# Patient Record
Sex: Female | Born: 1960 | Race: White | Hispanic: No | Marital: Married | Smoking: Never smoker
Health system: Southern US, Community
[De-identification: ages and names within clinical notes are randomized; demographics above are authoritative.]

## PROBLEM LIST (undated history)

## (undated) DIAGNOSIS — E079 Disorder of thyroid, unspecified: Secondary | ICD-10-CM

## (undated) HISTORY — DX: Disorder of thyroid, unspecified: E07.9

---

## 1998-03-03 ENCOUNTER — Other Ambulatory Visit: Admission: RE | Admit: 1998-03-03 | Discharge: 1998-03-03 | Payer: Self-pay | Admitting: Obstetrics and Gynecology

## 2000-05-06 ENCOUNTER — Other Ambulatory Visit: Admission: RE | Admit: 2000-05-06 | Discharge: 2000-05-06 | Payer: Self-pay | Admitting: Obstetrics and Gynecology

## 2001-05-23 ENCOUNTER — Other Ambulatory Visit: Admission: RE | Admit: 2001-05-23 | Discharge: 2001-05-23 | Payer: Self-pay | Admitting: Obstetrics and Gynecology

## 2002-05-26 ENCOUNTER — Other Ambulatory Visit: Admission: RE | Admit: 2002-05-26 | Discharge: 2002-05-26 | Payer: Self-pay | Admitting: Obstetrics and Gynecology

## 2003-06-01 ENCOUNTER — Other Ambulatory Visit: Admission: RE | Admit: 2003-06-01 | Discharge: 2003-06-01 | Payer: Self-pay | Admitting: Obstetrics and Gynecology

## 2004-11-01 ENCOUNTER — Other Ambulatory Visit: Admission: RE | Admit: 2004-11-01 | Discharge: 2004-11-01 | Payer: Self-pay | Admitting: Obstetrics and Gynecology

## 2011-01-05 ENCOUNTER — Ambulatory Visit (INDEPENDENT_AMBULATORY_CARE_PROVIDER_SITE_OTHER): Payer: BC Managed Care – PPO

## 2011-01-05 DIAGNOSIS — Z23 Encounter for immunization: Secondary | ICD-10-CM

## 2013-10-06 ENCOUNTER — Other Ambulatory Visit: Payer: Self-pay | Admitting: Orthopaedic Surgery

## 2013-10-06 DIAGNOSIS — M542 Cervicalgia: Secondary | ICD-10-CM

## 2013-10-10 ENCOUNTER — Ambulatory Visit
Admission: RE | Admit: 2013-10-10 | Discharge: 2013-10-10 | Disposition: A | Discharge status: Home / Self Care | Payer: BC Managed Care – PPO | Source: Ambulatory Visit | Attending: Orthopaedic Surgery | Admitting: Orthopaedic Surgery

## 2013-10-10 DIAGNOSIS — M542 Cervicalgia: Secondary | ICD-10-CM

## 2013-11-09 ENCOUNTER — Ambulatory Visit (INDEPENDENT_AMBULATORY_CARE_PROVIDER_SITE_OTHER): Payer: BC Managed Care – PPO | Admitting: Neurology

## 2013-11-09 ENCOUNTER — Ambulatory Visit (INDEPENDENT_AMBULATORY_CARE_PROVIDER_SITE_OTHER): Payer: Self-pay

## 2013-11-09 DIAGNOSIS — M4722 Other spondylosis with radiculopathy, cervical region: Secondary | ICD-10-CM

## 2013-11-09 DIAGNOSIS — M542 Cervicalgia: Secondary | ICD-10-CM

## 2013-11-09 DIAGNOSIS — Z0289 Encounter for other administrative examinations: Secondary | ICD-10-CM

## 2013-11-09 NOTE — Progress Notes (Signed)
   [facility]  Provider:  Dr Lucia GaskinsAhern Referring Provider: Letta KocherSaullo, Thomas, MD Primary Care Physician:  Lolita PatellaEADE,ROBERT ALEXANDER, MD   HPI:  Brandi DeterKathleen J Aguilar is a 53 y.o. female here as a referral from Dr. Retia PasseSaullo for neck pain. She reports left-sided neck pain that has been worsening despite physical therapy. She endorses radiation down the arm to the ulnar aspect of the forearm. 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   Summary: Nerve Conduction studies were performed on the bilateral upper extremities.  Bilateral Median motor nerves were within normal limits with normal F wave latencies.  The left Ulnar motor nerve showed reduced amplitude (0.6367mV, N>5) with normal F wave latency. The right Ulnar motor nerve was within normal limits with normal F wave latency.  Bilateral 2nd-digit Median and bilateral 5th-digit Ulnar sensory conductions were within normal limits.    EMG needle evaluation was performed on selected left upper extremity muscles: The left Triceps showed increased motor unit duration, and diminished recruitment. The left Flexor Digitorum Profundus (ulnar), First Dorsal Interosseous and Opponens Pollicis showed markedly increased motor unit amplitude, increased motor unit duration, and diminished recruitment. The left Extensor Indicis showed diminished recruitment. The left infraspinatus, left Supraspinatus, left Deltoid, left Biceps, left Pronator Teres muscles and left C7/C8 paraspinal muscles were normal.  Conclusion: This is an abnormal study. 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 No electrophysiologic evidence for  acute/ongoing cervical radiculopathy.   Naomie DeanAntonia Ahern, MD  [facility] [address] [place], [place] [id number]  Phone [phone] Fax [phone]

## 2014-06-26 ENCOUNTER — Ambulatory Visit (INDEPENDENT_AMBULATORY_CARE_PROVIDER_SITE_OTHER): Payer: BLUE CROSS/BLUE SHIELD | Admitting: Physician Assistant

## 2014-06-26 VITALS — BP 124/78 | HR 98 | Temp 98.8°F | Resp 17 | Ht 67.5 in | Wt 145.0 lb

## 2014-06-26 DIAGNOSIS — G43909 Migraine, unspecified, not intractable, without status migrainosus: Secondary | ICD-10-CM | POA: Insufficient documentation

## 2014-06-26 DIAGNOSIS — L255 Unspecified contact dermatitis due to plants, except food: Secondary | ICD-10-CM | POA: Diagnosis not present

## 2014-06-26 DIAGNOSIS — H409 Unspecified glaucoma: Secondary | ICD-10-CM | POA: Insufficient documentation

## 2014-06-26 DIAGNOSIS — L299 Pruritus, unspecified: Secondary | ICD-10-CM

## 2014-06-26 MED ORDER — HYDROXYZINE HCL 25 MG PO TABS: 25.0000 mg | ORAL_TABLET | Freq: Three times a day (TID) | ORAL | Status: AC | PRN

## 2014-06-26 MED ORDER — TRIAMCINOLONE ACETONIDE 0.5 % EX CREA: 1.0000 "application " | TOPICAL_CREAM | Freq: Two times a day (BID) | CUTANEOUS | Status: AC

## 2014-06-26 NOTE — Progress Notes (Signed)
   Brandi Aguilar  MRN: [id number] DOB: [birth year]  Subjective:  Pt presents to clinic with rash on her right arm and at the back of her neck.  She was working in the yard about 2 weeks ago and thinks now that she might have been exposed to poison ivy.  She started to break out about 5 days ago and the worst is the itching and burning at the rash site.  She has used some benadryl spray but that does not help much.  She has only had poison ivy once in the past and it was a similar delayed rash response. Home treatment - benadryl spray  Patient Active Problem List   Diagnosis Date Noted  . Hyperthyroidism 06/26/2014  . Migraines 06/26/2014  . Glaucoma 06/26/2014    No current outpatient prescriptions on file prior to visit.   No current facility-administered medications on file prior to visit.    Allergies  Allergen Reactions  . Keflex [Cephalexin] Other (See Comments)    Doesn't remember    Review of Systems  Constitutional: Negative for fever and chills.  Skin: Positive for rash.   Objective:  BP 124/78 mmHg  Pulse 98  Temp(Src) 98.8 F (37.1 C) (Oral)  Resp 17  Ht 5' 7.5" (1.715 m)  Wt 145 lb (65.772 kg)  BMI 22.36 kg/m2  SpO2 99%  Physical Exam  Constitutional: She is oriented to person, place, and time and well-developed, well-nourished, and in no distress.  HENT:  Head: Normocephalic and atraumatic.  Right Ear: Hearing and external ear normal.  Left Ear: Hearing and external ear normal.  Eyes: Conjunctivae are normal.  Neck: Normal range of motion.  Neurological: She is alert and oriented to person, place, and time. Gait normal.  Skin: Skin is warm and dry. Rash (vesicular rash on flexor surface of elbow on the right - some scabed - no surrounding erythema or others signs of infection) noted.  Psychiatric: Mood, memory, affect and judgment normal.  Vitals reviewed.  Assessment and Plan :  Itching  Rhus dermatitis - Plan: hydrOXYzine (ATARAX/VISTARIL)  25 MG tablet, triamcinolone cream (KENALOG) 0.5 %   Pt has poison ivy and the amount of rash will require topical treatment only and treatment for the itching.  We discussed ways to help with the itching.  Benny Lennert PA-C  [facility] 06/26/2014 9:17 AM

## 2014-08-30 ENCOUNTER — Ambulatory Visit: Payer: Self-pay | Admitting: Endocrinology

## 2015-08-18 DIAGNOSIS — M4722 Other spondylosis with radiculopathy, cervical region: Secondary | ICD-10-CM | POA: Diagnosis not present

## 2015-08-18 DIAGNOSIS — M5416 Radiculopathy, lumbar region: Secondary | ICD-10-CM | POA: Diagnosis not present

## 2015-08-23 DIAGNOSIS — E0590 Thyrotoxicosis, unspecified without thyrotoxic crisis or storm: Secondary | ICD-10-CM | POA: Diagnosis not present

## 2015-09-22 DIAGNOSIS — M5413 Radiculopathy, cervicothoracic region: Secondary | ICD-10-CM | POA: Diagnosis not present

## 2015-09-22 DIAGNOSIS — M5416 Radiculopathy, lumbar region: Secondary | ICD-10-CM | POA: Diagnosis not present

## 2015-10-04 DIAGNOSIS — H401211 Low-tension glaucoma, right eye, mild stage: Secondary | ICD-10-CM | POA: Diagnosis not present

## 2015-10-04 DIAGNOSIS — H5213 Myopia, bilateral: Secondary | ICD-10-CM | POA: Diagnosis not present

## 2015-10-04 DIAGNOSIS — H401221 Low-tension glaucoma, left eye, mild stage: Secondary | ICD-10-CM | POA: Diagnosis not present

## 2015-10-14 DIAGNOSIS — Z6822 Body mass index (BMI) 22.0-22.9, adult: Secondary | ICD-10-CM | POA: Diagnosis not present

## 2015-10-14 DIAGNOSIS — Z01419 Encounter for gynecological examination (general) (routine) without abnormal findings: Secondary | ICD-10-CM | POA: Diagnosis not present

## 2015-10-14 DIAGNOSIS — Z1231 Encounter for screening mammogram for malignant neoplasm of breast: Secondary | ICD-10-CM | POA: Diagnosis not present

## 2015-10-20 DIAGNOSIS — Z23 Encounter for immunization: Secondary | ICD-10-CM | POA: Diagnosis not present

## 2015-11-03 DIAGNOSIS — Z6822 Body mass index (BMI) 22.0-22.9, adult: Secondary | ICD-10-CM | POA: Diagnosis not present

## 2015-11-03 DIAGNOSIS — M791 Myalgia: Secondary | ICD-10-CM | POA: Diagnosis not present

## 2015-11-03 DIAGNOSIS — M5413 Radiculopathy, cervicothoracic region: Secondary | ICD-10-CM | POA: Diagnosis not present

## 2015-11-03 DIAGNOSIS — M5416 Radiculopathy, lumbar region: Secondary | ICD-10-CM | POA: Diagnosis not present

## 2015-11-21 ENCOUNTER — Other Ambulatory Visit: Payer: Self-pay | Admitting: Rehabilitation

## 2015-11-21 DIAGNOSIS — M5416 Radiculopathy, lumbar region: Secondary | ICD-10-CM

## 2015-11-22 DIAGNOSIS — E0500 Thyrotoxicosis with diffuse goiter without thyrotoxic crisis or storm: Secondary | ICD-10-CM | POA: Diagnosis not present

## 2015-11-30 ENCOUNTER — Ambulatory Visit
Admission: RE | Admit: 2015-11-30 | Discharge: 2015-11-30 | Disposition: A | Discharge status: Home / Self Care | Payer: BLUE CROSS/BLUE SHIELD | Source: Ambulatory Visit | Attending: Rehabilitation | Admitting: Rehabilitation

## 2015-11-30 DIAGNOSIS — M5126 Other intervertebral disc displacement, lumbar region: Secondary | ICD-10-CM | POA: Diagnosis not present

## 2015-11-30 DIAGNOSIS — M5416 Radiculopathy, lumbar region: Secondary | ICD-10-CM

## 2015-12-01 DIAGNOSIS — M5126 Other intervertebral disc displacement, lumbar region: Secondary | ICD-10-CM | POA: Diagnosis not present

## 2015-12-01 DIAGNOSIS — M5416 Radiculopathy, lumbar region: Secondary | ICD-10-CM | POA: Diagnosis not present

## 2015-12-01 DIAGNOSIS — M5413 Radiculopathy, cervicothoracic region: Secondary | ICD-10-CM | POA: Diagnosis not present

## 2015-12-06 DIAGNOSIS — E0590 Thyrotoxicosis, unspecified without thyrotoxic crisis or storm: Secondary | ICD-10-CM | POA: Diagnosis not present

## 2015-12-15 DIAGNOSIS — G43839 Menstrual migraine, intractable, without status migrainosus: Secondary | ICD-10-CM | POA: Diagnosis not present

## 2015-12-15 DIAGNOSIS — G43111 Migraine with aura, intractable, with status migrainosus: Secondary | ICD-10-CM | POA: Diagnosis not present

## 2015-12-15 DIAGNOSIS — G43719 Chronic migraine without aura, intractable, without status migrainosus: Secondary | ICD-10-CM | POA: Diagnosis not present

## 2015-12-15 DIAGNOSIS — G43019 Migraine without aura, intractable, without status migrainosus: Secondary | ICD-10-CM | POA: Diagnosis not present

## 2015-12-20 DIAGNOSIS — Z13220 Encounter for screening for lipoid disorders: Secondary | ICD-10-CM | POA: Diagnosis not present

## 2015-12-20 DIAGNOSIS — M79674 Pain in right toe(s): Secondary | ICD-10-CM | POA: Diagnosis not present

## 2015-12-20 DIAGNOSIS — G4700 Insomnia, unspecified: Secondary | ICD-10-CM | POA: Diagnosis not present

## 2015-12-20 DIAGNOSIS — M47812 Spondylosis without myelopathy or radiculopathy, cervical region: Secondary | ICD-10-CM | POA: Diagnosis not present

## 2015-12-20 DIAGNOSIS — E0590 Thyrotoxicosis, unspecified without thyrotoxic crisis or storm: Secondary | ICD-10-CM | POA: Diagnosis not present

## 2015-12-20 DIAGNOSIS — Z0000 Encounter for general adult medical examination without abnormal findings: Secondary | ICD-10-CM | POA: Diagnosis not present

## 2016-01-05 DIAGNOSIS — E0500 Thyrotoxicosis with diffuse goiter without thyrotoxic crisis or storm: Secondary | ICD-10-CM | POA: Diagnosis not present

## 2016-02-06 DIAGNOSIS — E0500 Thyrotoxicosis with diffuse goiter without thyrotoxic crisis or storm: Secondary | ICD-10-CM | POA: Diagnosis not present

## 2016-03-08 DIAGNOSIS — E0500 Thyrotoxicosis with diffuse goiter without thyrotoxic crisis or storm: Secondary | ICD-10-CM | POA: Diagnosis not present

## 2016-04-05 DIAGNOSIS — E0500 Thyrotoxicosis with diffuse goiter without thyrotoxic crisis or storm: Secondary | ICD-10-CM | POA: Diagnosis not present

## 2016-04-09 DIAGNOSIS — H401231 Low-tension glaucoma, bilateral, mild stage: Secondary | ICD-10-CM | POA: Diagnosis not present

## 2016-04-12 DIAGNOSIS — E0590 Thyrotoxicosis, unspecified without thyrotoxic crisis or storm: Secondary | ICD-10-CM | POA: Diagnosis not present

## 2016-04-23 DIAGNOSIS — E0500 Thyrotoxicosis with diffuse goiter without thyrotoxic crisis or storm: Secondary | ICD-10-CM | POA: Diagnosis not present

## 2016-04-24 ENCOUNTER — Other Ambulatory Visit (HOSPITAL_COMMUNITY): Payer: Self-pay | Admitting: Endocrinology

## 2016-04-24 DIAGNOSIS — E0500 Thyrotoxicosis with diffuse goiter without thyrotoxic crisis or storm: Secondary | ICD-10-CM

## 2016-04-24 DIAGNOSIS — R002 Palpitations: Secondary | ICD-10-CM | POA: Diagnosis not present

## 2016-04-24 DIAGNOSIS — E041 Nontoxic single thyroid nodule: Secondary | ICD-10-CM | POA: Diagnosis not present

## 2016-04-26 ENCOUNTER — Encounter (HOSPITAL_COMMUNITY)
Admission: RE | Admit: 2016-04-26 | Discharge: 2016-04-26 | Disposition: A | Discharge status: Home / Self Care | Payer: BLUE CROSS/BLUE SHIELD | Source: Ambulatory Visit | Attending: Endocrinology | Admitting: Endocrinology

## 2016-04-26 DIAGNOSIS — E0500 Thyrotoxicosis with diffuse goiter without thyrotoxic crisis or storm: Secondary | ICD-10-CM | POA: Insufficient documentation

## 2016-04-26 MED ORDER — SODIUM IODIDE I 131 CAPSULE
11.5000 | Freq: Once | INTRAVENOUS | Status: AC | PRN
  Administered 2016-04-26: 11.5 via ORAL

## 2016-04-27 ENCOUNTER — Encounter (HOSPITAL_COMMUNITY)
Admission: RE | Admit: 2016-04-27 | Discharge: 2016-04-27 | Disposition: A | Discharge status: Home / Self Care | Payer: BLUE CROSS/BLUE SHIELD | Source: Ambulatory Visit | Attending: Endocrinology | Admitting: Endocrinology

## 2016-04-27 DIAGNOSIS — E0500 Thyrotoxicosis with diffuse goiter without thyrotoxic crisis or storm: Secondary | ICD-10-CM | POA: Diagnosis not present

## 2016-04-27 DIAGNOSIS — E041 Nontoxic single thyroid nodule: Secondary | ICD-10-CM | POA: Diagnosis not present

## 2016-04-27 MED ORDER — SODIUM PERTECHNETATE TC 99M INJECTION
10.0000 | Freq: Once | INTRAVENOUS | Status: AC | PRN
  Administered 2016-04-27: 10 via INTRAVENOUS

## 2016-04-30 ENCOUNTER — Ambulatory Visit (HOSPITAL_COMMUNITY)
Admission: RE | Admit: 2016-04-30 | Discharge: 2016-04-30 | Disposition: A | Discharge status: Home / Self Care | Payer: BLUE CROSS/BLUE SHIELD | Source: Ambulatory Visit | Attending: Endocrinology | Admitting: Endocrinology

## 2016-04-30 DIAGNOSIS — E0500 Thyrotoxicosis with diffuse goiter without thyrotoxic crisis or storm: Secondary | ICD-10-CM | POA: Insufficient documentation

## 2016-04-30 DIAGNOSIS — E0590 Thyrotoxicosis, unspecified without thyrotoxic crisis or storm: Secondary | ICD-10-CM | POA: Diagnosis not present

## 2016-04-30 LAB — HCG, SERUM, QUALITATIVE: PREG SERUM: NEGATIVE

## 2016-04-30 MED ORDER — SODIUM IODIDE I 131 CAPSULE
15.5000 | Freq: Once | INTRAVENOUS | Status: AC | PRN
  Administered 2016-04-30: 15.5 via ORAL

## 2016-05-03 DIAGNOSIS — E0500 Thyrotoxicosis with diffuse goiter without thyrotoxic crisis or storm: Secondary | ICD-10-CM | POA: Diagnosis not present

## 2016-05-07 ENCOUNTER — Other Ambulatory Visit (HOSPITAL_COMMUNITY): Payer: BLUE CROSS/BLUE SHIELD

## 2016-05-07 ENCOUNTER — Ambulatory Visit (HOSPITAL_COMMUNITY): Payer: BLUE CROSS/BLUE SHIELD

## 2016-05-08 ENCOUNTER — Other Ambulatory Visit (HOSPITAL_COMMUNITY): Payer: BLUE CROSS/BLUE SHIELD

## 2016-05-14 DIAGNOSIS — E0500 Thyrotoxicosis with diffuse goiter without thyrotoxic crisis or storm: Secondary | ICD-10-CM | POA: Diagnosis not present

## 2016-05-17 DIAGNOSIS — E0500 Thyrotoxicosis with diffuse goiter without thyrotoxic crisis or storm: Secondary | ICD-10-CM | POA: Diagnosis not present

## 2016-05-28 DIAGNOSIS — E0500 Thyrotoxicosis with diffuse goiter without thyrotoxic crisis or storm: Secondary | ICD-10-CM | POA: Diagnosis not present

## 2016-05-29 DIAGNOSIS — D225 Melanocytic nevi of trunk: Secondary | ICD-10-CM | POA: Diagnosis not present

## 2016-05-29 DIAGNOSIS — L821 Other seborrheic keratosis: Secondary | ICD-10-CM | POA: Diagnosis not present

## 2016-05-29 DIAGNOSIS — D1801 Hemangioma of skin and subcutaneous tissue: Secondary | ICD-10-CM | POA: Diagnosis not present

## 2016-05-29 DIAGNOSIS — L814 Other melanin hyperpigmentation: Secondary | ICD-10-CM | POA: Diagnosis not present

## 2016-05-31 DIAGNOSIS — E0590 Thyrotoxicosis, unspecified without thyrotoxic crisis or storm: Secondary | ICD-10-CM | POA: Diagnosis not present

## 2016-06-12 DIAGNOSIS — G43111 Migraine with aura, intractable, with status migrainosus: Secondary | ICD-10-CM | POA: Diagnosis not present

## 2016-06-12 DIAGNOSIS — G43019 Migraine without aura, intractable, without status migrainosus: Secondary | ICD-10-CM | POA: Diagnosis not present

## 2016-06-12 DIAGNOSIS — G43839 Menstrual migraine, intractable, without status migrainosus: Secondary | ICD-10-CM | POA: Diagnosis not present

## 2016-06-12 DIAGNOSIS — E0500 Thyrotoxicosis with diffuse goiter without thyrotoxic crisis or storm: Secondary | ICD-10-CM | POA: Diagnosis not present

## 2016-06-12 DIAGNOSIS — G43719 Chronic migraine without aura, intractable, without status migrainosus: Secondary | ICD-10-CM | POA: Diagnosis not present

## 2016-06-25 DIAGNOSIS — E0500 Thyrotoxicosis with diffuse goiter without thyrotoxic crisis or storm: Secondary | ICD-10-CM | POA: Diagnosis not present

## 2016-06-28 DIAGNOSIS — R002 Palpitations: Secondary | ICD-10-CM | POA: Diagnosis not present

## 2016-06-28 DIAGNOSIS — E0590 Thyrotoxicosis, unspecified without thyrotoxic crisis or storm: Secondary | ICD-10-CM | POA: Diagnosis not present

## 2016-07-02 DIAGNOSIS — E0500 Thyrotoxicosis with diffuse goiter without thyrotoxic crisis or storm: Secondary | ICD-10-CM | POA: Diagnosis not present

## 2016-07-09 DIAGNOSIS — E0500 Thyrotoxicosis with diffuse goiter without thyrotoxic crisis or storm: Secondary | ICD-10-CM | POA: Diagnosis not present

## 2016-07-16 DIAGNOSIS — E0500 Thyrotoxicosis with diffuse goiter without thyrotoxic crisis or storm: Secondary | ICD-10-CM | POA: Diagnosis not present

## 2016-07-23 DIAGNOSIS — E0500 Thyrotoxicosis with diffuse goiter without thyrotoxic crisis or storm: Secondary | ICD-10-CM | POA: Diagnosis not present

## 2016-08-03 ENCOUNTER — Ambulatory Visit (INDEPENDENT_AMBULATORY_CARE_PROVIDER_SITE_OTHER): Payer: BLUE CROSS/BLUE SHIELD | Admitting: Endocrinology

## 2016-08-03 ENCOUNTER — Encounter: Payer: Self-pay | Admitting: Endocrinology

## 2016-08-03 VITALS — BP 122/84 | HR 92 | Wt 136.6 lb

## 2016-08-03 DIAGNOSIS — E0590 Thyrotoxicosis, unspecified without thyrotoxic crisis or storm: Secondary | ICD-10-CM

## 2016-08-03 LAB — T4, FREE: Free T4: 0.57 ng/dL — ABNORMAL LOW (ref 0.60–1.60)

## 2016-08-03 LAB — TSH: TSH: 3.14 u[IU]/mL (ref 0.35–4.50)

## 2016-08-03 NOTE — Progress Notes (Signed)
Subjective:    Patient ID: Brandi Aguilar, female    DOB: 02-06-1960, 56 y.o.   MRN: [id number]  HPI Pt is referred by Dr Nicholos Johns, for hyperthyroidism.  Pt reports he was dx'ed with hyperthyroidism in 1998.  She took tapazole from 1998 until she took RAI in April of 2018.  She took tapazole again while the RAI was working, until she stopped last week, when it was changed to synthroid 75/d.  she has never had XRT to the anterior neck, or thyroid surgery.  she does not consume kelp or any other prescribed or non-prescribed thyroid medication.  she has never been on amiodarone.  She has moderate headache throughout the head, and assoc tremor.  Past Medical History:  Diagnosis Date  . Thyroid disease     No past surgical history on file.  Social History   Social History  . Marital status: Married    Spouse name: N/A  . Number of children: N/A  . Years of education: N/A   Occupational History  . Not on file.   Social History Main Topics  . Smoking status: Never Smoker  . Smokeless tobacco: Never Used  . Alcohol use No  . Drug use: No  . Sexual activity: No   Other Topics Concern  . Not on file   Social History Narrative  . No narrative on file    Current Outpatient Prescriptions on File Prior to Visit  Medication Sig Dispense Refill  . latanoprost (XALATAN) 0.005 % ophthalmic solution 1 drop at bedtime.    . rizatriptan (MAXALT) 10 MG tablet Take 10 mg by mouth as needed for migraine. May repeat in 2 hours if needed    . hydrOXYzine (ATARAX/VISTARIL) 25 MG tablet Take 1 tablet (25 mg total) by mouth 3 (three) times daily as needed for itching. (Patient not taking: Reported on 08/03/2016) 21 tablet 0  . triamcinolone cream (KENALOG) 0.5 % Apply 1 application topically 2 (two) times daily. (Patient not taking: Reported on 08/03/2016) 30 g 0  . zonisamide (ZONEGRAN) 100 MG capsule Take 250 mg by mouth daily.     No current facility-administered medications on file prior to  visit.     Allergies  Allergen Reactions  . Keflex [Cephalexin] Other (See Comments)    Doesn't remember    Family History  Problem Relation Age of Onset  . Hypertension Mother   . Diabetes Father   . Diabetes Brother   . Graves' disease Brother   . Hypertension Maternal Grandfather     BP 122/84   Pulse 92   Wt 136 lb 9.6 oz (62 kg)   SpO2 97%   BMI 21.08 kg/m    Review of Systems denies fever, hoarseness, diplopia, palpitations, sob, diarrhea, muscle weakness, excessive diaphoresis, anxiety, heat intolerance, easy bruising, and rhinorrhea.  She has regained a few lbs.  No seizure. She has frequent urination.     Objective:   Physical Exam VS: see vs page GEN: no distress HEAD: head: no deformity eyes: no periorbital swelling, no proptosis external nose and ears are normal mouth: no lesion seen NECK: supple, thyroid is not enlarged.  CHEST WALL: no deformity LUNGS: clear to auscultation CV: reg rate and rhythm, no murmur ABD: abdomen is soft, nontender.  no hepatosplenomegaly.  not distended.  no hernia MUSCULOSKELETAL: muscle bulk and strength are grossly normal.  no obvious joint swelling.  gait is normal and steady EXTEMITIES: no deformity.  no ulcer on the feet.  feet  are of normal color and temp.  no edema PULSES: dorsalis pedis intact bilat.  no carotid bruit NEURO:  cn 2-12 grossly intact.   readily moves all 4's.  sensation is intact to touch on the feet.  Slight tremor of the hands SKIN:  Normal texture and temperature.  No rash or suspicious lesion is visible.  Not diaphoretic NODES:  None palpable at the neck.   PSYCH: alert, well-oriented.  Does not appear anxious nor depressed.    I have reviewed outside records, and summarized: Pt was noted to have hyperthyroidism, and referred here.  [facility], and was noted to have a flare of hyperthyroidism.  Several other health probds were addressed, including insomnia, toe pain, and  paresthesias.   Lab Results  Component Value Date   TSH 3.14 08/03/2016      Assessment & Plan:  Post-RAI hypothyroidism, new.  She needs slightly increased rx.   Headache: pt says worse since tapazole was changed to synthroid.  Therefore, I offered pt the option of continuing the same synthroid for now.  Tremor: in this setting, pt should consider continuing same synthroid.

## 2016-08-03 NOTE — Patient Instructions (Signed)
blood tests are requested for you today.  We'll let you know about the results.  Please come back for a follow-up appointment in 1 month.  

## 2016-08-06 ENCOUNTER — Telehealth: Payer: Self-pay | Admitting: Endocrinology

## 2016-08-06 DIAGNOSIS — E0590 Thyrotoxicosis, unspecified without thyrotoxic crisis or storm: Secondary | ICD-10-CM

## 2016-08-06 NOTE — Telephone Encounter (Signed)
Called patient and she will come in two weeks to do labs.

## 2016-08-06 NOTE — Telephone Encounter (Signed)
Ok.  I requested labs for approx 2 weeks

## 2016-08-06 NOTE — Telephone Encounter (Signed)
Patient wants to stay on medication and dosage she is on now, and retest in two weeks.   Thank you,  -LL

## 2016-08-20 ENCOUNTER — Other Ambulatory Visit (INDEPENDENT_AMBULATORY_CARE_PROVIDER_SITE_OTHER): Payer: BLUE CROSS/BLUE SHIELD

## 2016-08-20 DIAGNOSIS — E0590 Thyrotoxicosis, unspecified without thyrotoxic crisis or storm: Secondary | ICD-10-CM | POA: Diagnosis not present

## 2016-08-20 LAB — T4, FREE: FREE T4: 0.86 ng/dL (ref 0.60–1.60)

## 2016-08-20 LAB — TSH: TSH: 0.81 u[IU]/mL (ref 0.35–4.50)

## 2016-08-23 ENCOUNTER — Other Ambulatory Visit: Payer: Self-pay

## 2016-08-23 ENCOUNTER — Encounter: Payer: Self-pay | Admitting: Endocrinology

## 2016-08-23 MED ORDER — LEVOTHYROXINE SODIUM 75 MCG PO TABS: 75.0000 ug | ORAL_TABLET | Freq: Every day | ORAL | 5 refills | Status: DC

## 2016-09-19 ENCOUNTER — Encounter: Payer: Self-pay | Admitting: Endocrinology

## 2016-09-19 ENCOUNTER — Ambulatory Visit (INDEPENDENT_AMBULATORY_CARE_PROVIDER_SITE_OTHER): Payer: BLUE CROSS/BLUE SHIELD | Admitting: Endocrinology

## 2016-09-19 VITALS — BP 122/78 | HR 80 | Wt 140.2 lb

## 2016-09-19 DIAGNOSIS — E0590 Thyrotoxicosis, unspecified without thyrotoxic crisis or storm: Secondary | ICD-10-CM

## 2016-09-19 DIAGNOSIS — Z23 Encounter for immunization: Secondary | ICD-10-CM | POA: Diagnosis not present

## 2016-09-19 LAB — TSH: TSH: 2.92 u[IU]/mL (ref 0.35–4.50)

## 2016-09-19 LAB — T4, FREE: Free T4: 0.79 ng/dL (ref 0.60–1.60)

## 2016-09-19 NOTE — Patient Instructions (Signed)
blood tests are requested for you today.  We'll let you know about the results.  Please come back for a follow-up appointment in 1-2 months.   You seem to need less than a full amount of the thyroid pill.  This seldom lasts forever--usually the overactivity comes back, or the thyroid fails altogether.

## 2016-09-19 NOTE — Progress Notes (Signed)
   Subjective:    Patient ID: Brandi Aguilar, female    DOB: Dec 17, 1960, 56 y.o.   MRN: [id number]  HPI Pt returns for f/u of hyperthyroidism (dx'ed 1998; she took tapazole from 1998 until she took RAI in April of 2018; she took tapazole again while the RAI was working, until she stopped in July of 2018, when it was changed to synthroid 75/d).  pt states she feels well in general.  She takes synthroid as rx'ed.   Past Medical History:  Diagnosis Date  . Thyroid disease     No past surgical history on file.  Social History   Social History  . Marital status: Married    Spouse name: N/A  . Number of children: N/A  . Years of education: N/A   Occupational History  . Not on file.   Social History Main Topics  . Smoking status: Never Smoker  . Smokeless tobacco: Never Used  . Alcohol use No  . Drug use: No  . Sexual activity: No   Other Topics Concern  . Not on file   Social History Narrative  . No narrative on file    Current Outpatient Prescriptions on File Prior to Visit  Medication Sig Dispense Refill  . hydrOXYzine (ATARAX/VISTARIL) 25 MG tablet Take 1 tablet (25 mg total) by mouth 3 (three) times daily as needed for itching. (Patient not taking: Reported on 08/03/2016) 21 tablet 0  . latanoprost (XALATAN) 0.005 % ophthalmic solution 1 drop at bedtime.    Marland Kitchen. levothyroxine (SYNTHROID, LEVOTHROID) 75 MCG tablet Take 1 tablet (75 mcg total) by mouth daily before breakfast. 30 tablet 5  . rizatriptan (MAXALT) 10 MG tablet Take 10 mg by mouth as needed for migraine. May repeat in 2 hours if needed    . triamcinolone cream (KENALOG) 0.5 % Apply 1 application topically 2 (two) times daily. (Patient not taking: Reported on 08/03/2016) 30 g 0  . zonisamide (ZONEGRAN) 100 MG capsule Take 250 mg by mouth daily.     No current facility-administered medications on file prior to visit.     Allergies  Allergen Reactions  . Keflex [Cephalexin] Other (See Comments)    Doesn't  remember    Family History  Problem Relation Age of Onset  . Hypertension Mother   . Diabetes Father   . Diabetes Brother   . Graves' disease Brother   . Hypertension Maternal Grandfather     BP 122/78   Pulse 80   Wt 140 lb 3.2 oz (63.6 kg)   SpO2 97%   BMI 21.63 kg/m    Review of Systems Denies cold intolerance    Objective:   Physical Exam VITAL SIGNS:  See vs page GENERAL: no distress NECK: There is no palpable thyroid enlargement.  No thyroid nodule is palpable.  No palpable lymphadenopathy at the anterior neck.      Assessment & Plan:  Post-RAI hypothyroidism: due for recheck.   Patient Instructions  blood tests are requested for you today.  We'll let you know about the results.  Please come back for a follow-up appointment in 1-2 months.   You seem to need less than a full amount of the thyroid pill.  This seldom lasts forever--usually the overactivity comes back, or the thyroid fails altogether.

## 2016-10-15 DIAGNOSIS — H52203 Unspecified astigmatism, bilateral: Secondary | ICD-10-CM | POA: Diagnosis not present

## 2016-10-15 DIAGNOSIS — H401231 Low-tension glaucoma, bilateral, mild stage: Secondary | ICD-10-CM | POA: Diagnosis not present

## 2016-10-19 ENCOUNTER — Ambulatory Visit (INDEPENDENT_AMBULATORY_CARE_PROVIDER_SITE_OTHER): Payer: BLUE CROSS/BLUE SHIELD | Admitting: Endocrinology

## 2016-10-19 ENCOUNTER — Encounter: Payer: Self-pay | Admitting: Endocrinology

## 2016-10-19 DIAGNOSIS — E039 Hypothyroidism, unspecified: Secondary | ICD-10-CM | POA: Insufficient documentation

## 2016-10-19 DIAGNOSIS — E890 Postprocedural hypothyroidism: Secondary | ICD-10-CM

## 2016-10-19 LAB — TSH: TSH: 3.05 u[IU]/mL (ref 0.35–4.50)

## 2016-10-19 NOTE — Patient Instructions (Signed)
blood tests are requested for you today.  We'll let you know about the results. You seem to need less than a full daily amount of thyroid medication.  Therefore, your need for medication will likely change over time.   You should have the thyroid blood test twice a year, and as needed. I would be happy to see you back here as needed

## 2016-10-19 NOTE — Progress Notes (Signed)
   Subjective:    Patient ID: Brandi Aguilar, female    DOB: 08/01/1960, 56 y.o.   MRN: [id number]  HPI Pt returns for f/u of post-RAI hypothyroidism (dx'ed 1998; she took tapazole from 1998 until she took RAI in April of 2018; nuc med scan was c/w Grave's Dz; she took tapazole again while the RAI was working, until she stopped in July of 2018, when it was changed to synthroid).  pt states she feels well in general.  She takes synthroid as rx'ed.  Past Medical History:  Diagnosis Date  . Thyroid disease     No past surgical history on file.  Social History   Social History  . Marital status: Married    Spouse name: N/A  . Number of children: N/A  . Years of education: N/A   Occupational History  . Not on file.   Social History Main Topics  . Smoking status: Never Smoker  . Smokeless tobacco: Never Used  . Alcohol use No  . Drug use: No  . Sexual activity: No   Other Topics Concern  . Not on file   Social History Narrative  . No narrative on file    Current Outpatient Prescriptions on File Prior to Visit  Medication Sig Dispense Refill  . hydrOXYzine (ATARAX/VISTARIL) 25 MG tablet Take 1 tablet (25 mg total) by mouth 3 (three) times daily as needed for itching. (Patient not taking: Reported on 08/03/2016) 21 tablet 0  . latanoprost (XALATAN) 0.005 % ophthalmic solution 1 drop at bedtime.    Marland Kitchen levothyroxine (SYNTHROID, LEVOTHROID) 75 MCG tablet Take 1 tablet (75 mcg total) by mouth daily before breakfast. 30 tablet 5  . rizatriptan (MAXALT) 10 MG tablet Take 10 mg by mouth as needed for migraine. May repeat in 2 hours if needed    . triamcinolone cream (KENALOG) 0.5 % Apply 1 application topically 2 (two) times daily. (Patient not taking: Reported on 08/03/2016) 30 g 0  . zonisamide (ZONEGRAN) 100 MG capsule Take 250 mg by mouth daily.     No current facility-administered medications on file prior to visit.     Allergies  Allergen Reactions  . Keflex [Cephalexin]  Other (See Comments)    Doesn't remember    Family History  Problem Relation Age of Onset  . Hypertension Mother   . Diabetes Father   . Diabetes Brother   . Graves' disease Brother   . Hypertension Maternal Grandfather     BP 118/82   Pulse 75   SpO2 96%    Review of Systems Denies neck pain    Objective:   Physical Exam VITAL SIGNS:  See vs page GENERAL: no distress NECK: There is no palpable thyroid enlargement.  No thyroid nodule is palpable.  No palpable lymphadenopathy at the anterior neck.       Assessment & Plan:  Post-RAI hypothyroidism: due for recheck  Patient Instructions  blood tests are requested for you today.  We'll let you know about the results. You seem to need less than a full daily amount of thyroid medication.  Therefore, your need for medication will likely change over time.   You should have the thyroid blood test twice a year, and as needed. I would be happy to see you back here as needed

## 2016-11-27 DIAGNOSIS — Z6822 Body mass index (BMI) 22.0-22.9, adult: Secondary | ICD-10-CM | POA: Diagnosis not present

## 2016-11-27 DIAGNOSIS — Z13820 Encounter for screening for osteoporosis: Secondary | ICD-10-CM | POA: Diagnosis not present

## 2016-11-27 DIAGNOSIS — Z1231 Encounter for screening mammogram for malignant neoplasm of breast: Secondary | ICD-10-CM | POA: Diagnosis not present

## 2016-11-27 DIAGNOSIS — Z01419 Encounter for gynecological examination (general) (routine) without abnormal findings: Secondary | ICD-10-CM | POA: Diagnosis not present

## 2016-12-19 ENCOUNTER — Encounter: Payer: Self-pay | Admitting: Endocrinology

## 2016-12-21 ENCOUNTER — Encounter: Payer: Self-pay | Admitting: Endocrinology

## 2016-12-21 ENCOUNTER — Ambulatory Visit (INDEPENDENT_AMBULATORY_CARE_PROVIDER_SITE_OTHER): Payer: BLUE CROSS/BLUE SHIELD | Admitting: Endocrinology

## 2016-12-21 VITALS — BP 120/84 | HR 88 | Wt 137.0 lb

## 2016-12-21 DIAGNOSIS — E890 Postprocedural hypothyroidism: Secondary | ICD-10-CM

## 2016-12-21 LAB — TSH: TSH: 2.49 u[IU]/mL (ref 0.35–4.50)

## 2016-12-21 NOTE — Progress Notes (Signed)
Subjective:    Patient ID: Brandi Aguilar, female    DOB: 1960-10-19, 56 y.o.   MRN: [id number]  HPI Pt returns for f/u of post-RAI hypothyroidism (dx'ed 1998; she took tapazole from 1998 until she took RAI in April of 2018; nuc med scan was c/w Grave's Dz; she took tapazole again while the RAI was working, until she stopped in July of 2018, when it was changed to synthroid).  she has 1 week of anxiety, and assoc diarrhea.  Past Medical History:  Diagnosis Date  . Thyroid disease     History reviewed. No pertinent surgical history.  Social History   Socioeconomic History  . Marital status: Married    Spouse name: Not on file  . Number of children: Not on file  . Years of education: Not on file  . Highest education level: Not on file  Social Needs  . Financial resource strain: Not on file  . Food insecurity - worry: Not on file  . Food insecurity - inability: Not on file  . Transportation needs - medical: Not on file  . Transportation needs - non-medical: Not on file  Occupational History  . Not on file  Tobacco Use  . Smoking status: Never Smoker  . Smokeless tobacco: Never Used  Substance and Sexual Activity  . Alcohol use: No    Alcohol/week: 0.0 oz  . Drug use: No  . Sexual activity: No    Birth control/protection: Abstinence  Other Topics Concern  . Not on file  Social History Narrative  . Not on file    Current Outpatient Medications on File Prior to Visit  Medication Sig Dispense Refill  . hydrOXYzine (ATARAX/VISTARIL) 25 MG tablet Take 1 tablet (25 mg total) by mouth 3 (three) times daily as needed for itching. 21 tablet 0  . latanoprost (XALATAN) 0.005 % ophthalmic solution 1 drop at bedtime.    Marland Kitchen. levothyroxine (SYNTHROID, LEVOTHROID) 75 MCG tablet Take 1 tablet (75 mcg total) by mouth daily before breakfast. 30 tablet 5  . rizatriptan (MAXALT) 10 MG tablet Take 10 mg by mouth as needed for migraine. May repeat in 2 hours if needed    . triamcinolone  cream (KENALOG) 0.5 % Apply 1 application topically 2 (two) times daily. 30 g 0  . zonisamide (ZONEGRAN) 100 MG capsule Take 250 mg by mouth daily.     No current facility-administered medications on file prior to visit.     Allergies  Allergen Reactions  . Keflex [Cephalexin] Other (See Comments)    Doesn't remember    Family History  Problem Relation Age of Onset  . Hypertension Mother   . Diabetes Father   . Diabetes Brother   . Graves' disease Brother   . Hypertension Maternal Grandfather     BP 120/84 (BP Location: Left Arm, Patient Position: Sitting, Cuff Size: Normal)   Pulse 88   Wt 137 lb (62.1 kg)   SpO2 95%   BMI 21.14 kg/m     Review of Systems She has slight nausea.      Objective:   Physical Exam VITAL SIGNS:  See vs page GENERAL: no distress NECK: There is no palpable thyroid enlargement.  No thyroid nodule is palpable.  No palpable lymphadenopathy at the anterior neck.      Assessment & Plan:  Hypothyroidism, recheck today, in view of sxs  Patient Instructions  blood tests are requested for you today.  We'll let you know about the results. I would be  happy to see you back here as needed

## 2016-12-21 NOTE — Patient Instructions (Signed)
blood tests are requested for you today.  We'll let you know about the results. I would be happy to see you back here as needed.   

## 2016-12-27 DIAGNOSIS — G43111 Migraine with aura, intractable, with status migrainosus: Secondary | ICD-10-CM | POA: Diagnosis not present

## 2016-12-27 DIAGNOSIS — G43839 Menstrual migraine, intractable, without status migrainosus: Secondary | ICD-10-CM | POA: Diagnosis not present

## 2016-12-27 DIAGNOSIS — G43719 Chronic migraine without aura, intractable, without status migrainosus: Secondary | ICD-10-CM | POA: Diagnosis not present

## 2016-12-27 DIAGNOSIS — G43019 Migraine without aura, intractable, without status migrainosus: Secondary | ICD-10-CM | POA: Diagnosis not present

## 2016-12-31 DIAGNOSIS — G4700 Insomnia, unspecified: Secondary | ICD-10-CM | POA: Diagnosis not present

## 2016-12-31 DIAGNOSIS — Z0000 Encounter for general adult medical examination without abnormal findings: Secondary | ICD-10-CM | POA: Diagnosis not present

## 2016-12-31 DIAGNOSIS — E0590 Thyrotoxicosis, unspecified without thyrotoxic crisis or storm: Secondary | ICD-10-CM | POA: Diagnosis not present

## 2017-01-10 DIAGNOSIS — Z13220 Encounter for screening for lipoid disorders: Secondary | ICD-10-CM | POA: Diagnosis not present

## 2017-01-10 DIAGNOSIS — E0590 Thyrotoxicosis, unspecified without thyrotoxic crisis or storm: Secondary | ICD-10-CM | POA: Diagnosis not present

## 2017-03-06 ENCOUNTER — Other Ambulatory Visit: Payer: Self-pay | Admitting: Endocrinology

## 2017-04-03 DIAGNOSIS — M25561 Pain in right knee: Secondary | ICD-10-CM | POA: Diagnosis not present

## 2017-04-04 ENCOUNTER — Other Ambulatory Visit: Payer: Self-pay | Admitting: Endocrinology

## 2017-04-15 DIAGNOSIS — H401231 Low-tension glaucoma, bilateral, mild stage: Secondary | ICD-10-CM | POA: Diagnosis not present

## 2017-05-01 DIAGNOSIS — E0590 Thyrotoxicosis, unspecified without thyrotoxic crisis or storm: Secondary | ICD-10-CM | POA: Diagnosis not present

## 2017-06-27 DIAGNOSIS — G43719 Chronic migraine without aura, intractable, without status migrainosus: Secondary | ICD-10-CM | POA: Diagnosis not present

## 2017-06-27 DIAGNOSIS — G43839 Menstrual migraine, intractable, without status migrainosus: Secondary | ICD-10-CM | POA: Diagnosis not present

## 2017-06-27 DIAGNOSIS — G43019 Migraine without aura, intractable, without status migrainosus: Secondary | ICD-10-CM | POA: Diagnosis not present

## 2017-06-27 DIAGNOSIS — G43111 Migraine with aura, intractable, with status migrainosus: Secondary | ICD-10-CM | POA: Diagnosis not present

## 2017-07-03 DIAGNOSIS — L814 Other melanin hyperpigmentation: Secondary | ICD-10-CM | POA: Diagnosis not present

## 2017-07-03 DIAGNOSIS — D1801 Hemangioma of skin and subcutaneous tissue: Secondary | ICD-10-CM | POA: Diagnosis not present

## 2017-07-03 DIAGNOSIS — L820 Inflamed seborrheic keratosis: Secondary | ICD-10-CM | POA: Diagnosis not present

## 2017-07-03 DIAGNOSIS — D225 Melanocytic nevi of trunk: Secondary | ICD-10-CM | POA: Diagnosis not present

## 2017-07-03 DIAGNOSIS — L821 Other seborrheic keratosis: Secondary | ICD-10-CM | POA: Diagnosis not present

## 2017-08-30 DIAGNOSIS — E0590 Thyrotoxicosis, unspecified without thyrotoxic crisis or storm: Secondary | ICD-10-CM | POA: Diagnosis not present

## 2017-10-18 DIAGNOSIS — H52203 Unspecified astigmatism, bilateral: Secondary | ICD-10-CM | POA: Diagnosis not present

## 2017-10-18 DIAGNOSIS — H401231 Low-tension glaucoma, bilateral, mild stage: Secondary | ICD-10-CM | POA: Diagnosis not present

## 2017-12-24 DIAGNOSIS — Z6823 Body mass index (BMI) 23.0-23.9, adult: Secondary | ICD-10-CM | POA: Diagnosis not present

## 2017-12-24 DIAGNOSIS — Z01419 Encounter for gynecological examination (general) (routine) without abnormal findings: Secondary | ICD-10-CM | POA: Diagnosis not present

## 2017-12-24 DIAGNOSIS — Z1231 Encounter for screening mammogram for malignant neoplasm of breast: Secondary | ICD-10-CM | POA: Diagnosis not present

## 2017-12-26 ENCOUNTER — Other Ambulatory Visit: Payer: Self-pay | Admitting: Obstetrics and Gynecology

## 2017-12-26 DIAGNOSIS — R928 Other abnormal and inconclusive findings on diagnostic imaging of breast: Secondary | ICD-10-CM

## 2017-12-30 DIAGNOSIS — G43111 Migraine with aura, intractable, with status migrainosus: Secondary | ICD-10-CM | POA: Diagnosis not present

## 2017-12-30 DIAGNOSIS — G43839 Menstrual migraine, intractable, without status migrainosus: Secondary | ICD-10-CM | POA: Diagnosis not present

## 2017-12-30 DIAGNOSIS — G43019 Migraine without aura, intractable, without status migrainosus: Secondary | ICD-10-CM | POA: Diagnosis not present

## 2017-12-30 DIAGNOSIS — G43719 Chronic migraine without aura, intractable, without status migrainosus: Secondary | ICD-10-CM | POA: Diagnosis not present

## 2017-12-31 ENCOUNTER — Ambulatory Visit
Admission: RE | Admit: 2017-12-31 | Discharge: 2017-12-31 | Disposition: A | Discharge status: Home / Self Care | Payer: BLUE CROSS/BLUE SHIELD | Source: Ambulatory Visit | Attending: Obstetrics and Gynecology | Admitting: Obstetrics and Gynecology

## 2017-12-31 ENCOUNTER — Ambulatory Visit: Admission: RE | Admit: 2017-12-31 | Payer: BLUE CROSS/BLUE SHIELD | Source: Ambulatory Visit

## 2017-12-31 DIAGNOSIS — R922 Inconclusive mammogram: Secondary | ICD-10-CM | POA: Diagnosis not present

## 2017-12-31 DIAGNOSIS — R928 Other abnormal and inconclusive findings on diagnostic imaging of breast: Secondary | ICD-10-CM

## 2018-01-21 DIAGNOSIS — M79674 Pain in right toe(s): Secondary | ICD-10-CM | POA: Diagnosis not present

## 2018-01-21 DIAGNOSIS — G4700 Insomnia, unspecified: Secondary | ICD-10-CM | POA: Diagnosis not present

## 2018-01-21 DIAGNOSIS — Z23 Encounter for immunization: Secondary | ICD-10-CM | POA: Diagnosis not present

## 2018-01-21 DIAGNOSIS — Z13220 Encounter for screening for lipoid disorders: Secondary | ICD-10-CM | POA: Diagnosis not present

## 2018-01-21 DIAGNOSIS — Z0000 Encounter for general adult medical examination without abnormal findings: Secondary | ICD-10-CM | POA: Diagnosis not present

## 2018-01-21 DIAGNOSIS — M25561 Pain in right knee: Secondary | ICD-10-CM | POA: Diagnosis not present

## 2018-01-21 DIAGNOSIS — Z1159 Encounter for screening for other viral diseases: Secondary | ICD-10-CM | POA: Diagnosis not present

## 2018-01-21 DIAGNOSIS — E0590 Thyrotoxicosis, unspecified without thyrotoxic crisis or storm: Secondary | ICD-10-CM | POA: Diagnosis not present

## 2018-06-12 DIAGNOSIS — Z23 Encounter for immunization: Secondary | ICD-10-CM | POA: Diagnosis not present

## 2018-07-07 DIAGNOSIS — G43839 Menstrual migraine, intractable, without status migrainosus: Secondary | ICD-10-CM | POA: Diagnosis not present

## 2018-07-07 DIAGNOSIS — G43719 Chronic migraine without aura, intractable, without status migrainosus: Secondary | ICD-10-CM | POA: Diagnosis not present

## 2018-07-07 DIAGNOSIS — G43019 Migraine without aura, intractable, without status migrainosus: Secondary | ICD-10-CM | POA: Diagnosis not present

## 2018-07-25 DIAGNOSIS — H401231 Low-tension glaucoma, bilateral, mild stage: Secondary | ICD-10-CM | POA: Diagnosis not present

## 2018-11-19 DIAGNOSIS — Z23 Encounter for immunization: Secondary | ICD-10-CM | POA: Diagnosis not present

## 2019-01-27 DIAGNOSIS — Z01419 Encounter for gynecological examination (general) (routine) without abnormal findings: Secondary | ICD-10-CM | POA: Diagnosis not present

## 2019-01-27 DIAGNOSIS — Z1231 Encounter for screening mammogram for malignant neoplasm of breast: Secondary | ICD-10-CM | POA: Diagnosis not present

## 2019-01-27 DIAGNOSIS — Z6825 Body mass index (BMI) 25.0-25.9, adult: Secondary | ICD-10-CM | POA: Diagnosis not present

## 2019-02-02 DIAGNOSIS — H401231 Low-tension glaucoma, bilateral, mild stage: Secondary | ICD-10-CM | POA: Diagnosis not present

## 2019-02-10 DIAGNOSIS — G43019 Migraine without aura, intractable, without status migrainosus: Secondary | ICD-10-CM | POA: Diagnosis not present

## 2019-02-10 DIAGNOSIS — G43839 Menstrual migraine, intractable, without status migrainosus: Secondary | ICD-10-CM | POA: Diagnosis not present

## 2019-02-10 DIAGNOSIS — G43719 Chronic migraine without aura, intractable, without status migrainosus: Secondary | ICD-10-CM | POA: Diagnosis not present

## 2019-02-10 DIAGNOSIS — G43111 Migraine with aura, intractable, with status migrainosus: Secondary | ICD-10-CM | POA: Diagnosis not present

## 2019-03-10 DIAGNOSIS — Z13220 Encounter for screening for lipoid disorders: Secondary | ICD-10-CM | POA: Diagnosis not present

## 2019-03-10 DIAGNOSIS — E0590 Thyrotoxicosis, unspecified without thyrotoxic crisis or storm: Secondary | ICD-10-CM | POA: Diagnosis not present

## 2019-03-10 DIAGNOSIS — Z0000 Encounter for general adult medical examination without abnormal findings: Secondary | ICD-10-CM | POA: Diagnosis not present

## 2019-03-10 DIAGNOSIS — M79674 Pain in right toe(s): Secondary | ICD-10-CM | POA: Diagnosis not present

## 2019-03-10 DIAGNOSIS — G4700 Insomnia, unspecified: Secondary | ICD-10-CM | POA: Diagnosis not present

## 2019-04-02 ENCOUNTER — Ambulatory Visit: Payer: BC Managed Care – PPO | Attending: Internal Medicine

## 2019-04-02 DIAGNOSIS — Z23 Encounter for immunization: Secondary | ICD-10-CM

## 2019-04-02 NOTE — Progress Notes (Signed)
   Covid-19 Vaccination Clinic  Name:  Brandi Aguilar    MRN: [id number] DOB: [birth year]  04/02/2019  Ms. Law was observed post Covid-19 immunization for 15 minutes without incident. She was provided with Vaccine Information Sheet and instruction to access the V-Safe system.   [name] was instructed to call 911 with any severe reactions post vaccine: Marland Kitchen Difficulty breathing  . Swelling of face and throat  . A fast heartbeat  . A bad rash all over body  . Dizziness and weakness   Immunizations Administered    Name Date Dose VIS Date Route   Pfizer COVID-19 Vaccine 04/02/2019  9:43 AM 0.3 mL 12/26/2018 Intramuscular   Manufacturer: [employer], [employer]   Lot: [id number]   NDC: [address]-6

## 2019-04-27 ENCOUNTER — Ambulatory Visit: Payer: BC Managed Care – PPO | Attending: Internal Medicine

## 2019-04-27 DIAGNOSIS — Z23 Encounter for immunization: Secondary | ICD-10-CM

## 2019-04-27 NOTE — Progress Notes (Signed)
   Covid-19 Vaccination Clinic  Name:  Brandi Aguilar    MRN: [id number] DOB: 03-06-1960  04/27/2019  Brandi Aguilar was observed post Covid-19 immunization for 15 minutes without incident. She was provided with Vaccine Information Sheet and instruction to access the V-Safe system.   Brandi Aguilar was instructed to call 911 with any severe reactions post vaccine: Marland Kitchen Difficulty breathing  . Swelling of face and throat  . A fast heartbeat  . A bad rash all over body  . Dizziness and weakness   Immunizations Administered    Name Date Dose VIS Date Route   Pfizer COVID-19 Vaccine 04/27/2019  9:23 AM 0.3 mL 12/26/2018 Intramuscular   Manufacturer: [employer], [employer]   Lot: [id number]   NDC: [address]-0

## 2019-05-11 DIAGNOSIS — M79601 Pain in right arm: Secondary | ICD-10-CM | POA: Diagnosis not present

## 2019-05-29 DIAGNOSIS — G5631 Lesion of radial nerve, right upper limb: Secondary | ICD-10-CM | POA: Diagnosis not present

## 2019-05-29 DIAGNOSIS — M25521 Pain in right elbow: Secondary | ICD-10-CM | POA: Diagnosis not present

## 2019-06-11 DIAGNOSIS — G5600 Carpal tunnel syndrome, unspecified upper limb: Secondary | ICD-10-CM | POA: Diagnosis not present

## 2019-07-03 DIAGNOSIS — G5622 Lesion of ulnar nerve, left upper limb: Secondary | ICD-10-CM | POA: Diagnosis not present

## 2019-07-03 DIAGNOSIS — G5631 Lesion of radial nerve, right upper limb: Secondary | ICD-10-CM | POA: Diagnosis not present

## 2019-07-24 DIAGNOSIS — G5622 Lesion of ulnar nerve, left upper limb: Secondary | ICD-10-CM | POA: Diagnosis not present

## 2019-07-24 DIAGNOSIS — G5631 Lesion of radial nerve, right upper limb: Secondary | ICD-10-CM | POA: Diagnosis not present

## 2019-08-11 DIAGNOSIS — G43019 Migraine without aura, intractable, without status migrainosus: Secondary | ICD-10-CM | POA: Diagnosis not present

## 2019-08-11 DIAGNOSIS — G43111 Migraine with aura, intractable, with status migrainosus: Secondary | ICD-10-CM | POA: Diagnosis not present

## 2019-08-11 DIAGNOSIS — G43839 Menstrual migraine, intractable, without status migrainosus: Secondary | ICD-10-CM | POA: Diagnosis not present

## 2019-08-11 DIAGNOSIS — G43719 Chronic migraine without aura, intractable, without status migrainosus: Secondary | ICD-10-CM | POA: Diagnosis not present

## 2019-08-26 DIAGNOSIS — H401231 Low-tension glaucoma, bilateral, mild stage: Secondary | ICD-10-CM | POA: Diagnosis not present

## 2019-12-04 DIAGNOSIS — Z23 Encounter for immunization: Secondary | ICD-10-CM | POA: Diagnosis not present

## 2019-12-22 DIAGNOSIS — Z1159 Encounter for screening for other viral diseases: Secondary | ICD-10-CM | POA: Diagnosis not present

## 2019-12-25 DIAGNOSIS — K5140 Inflammatory polyps of colon without complications: Secondary | ICD-10-CM | POA: Diagnosis not present

## 2019-12-25 DIAGNOSIS — D122 Benign neoplasm of ascending colon: Secondary | ICD-10-CM | POA: Diagnosis not present

## 2019-12-25 DIAGNOSIS — K635 Polyp of colon: Secondary | ICD-10-CM | POA: Diagnosis not present

## 2019-12-25 DIAGNOSIS — Z1211 Encounter for screening for malignant neoplasm of colon: Secondary | ICD-10-CM | POA: Diagnosis not present

## 2020-01-29 DIAGNOSIS — Z6823 Body mass index (BMI) 23.0-23.9, adult: Secondary | ICD-10-CM | POA: Diagnosis not present

## 2020-01-29 DIAGNOSIS — Z13820 Encounter for screening for osteoporosis: Secondary | ICD-10-CM | POA: Diagnosis not present

## 2020-01-29 DIAGNOSIS — Z01419 Encounter for gynecological examination (general) (routine) without abnormal findings: Secondary | ICD-10-CM | POA: Diagnosis not present

## 2020-01-29 DIAGNOSIS — Z1231 Encounter for screening mammogram for malignant neoplasm of breast: Secondary | ICD-10-CM | POA: Diagnosis not present

## 2020-03-09 DIAGNOSIS — H401231 Low-tension glaucoma, bilateral, mild stage: Secondary | ICD-10-CM | POA: Diagnosis not present

## 2020-03-09 DIAGNOSIS — H52203 Unspecified astigmatism, bilateral: Secondary | ICD-10-CM | POA: Diagnosis not present

## 2020-03-16 DIAGNOSIS — R739 Hyperglycemia, unspecified: Secondary | ICD-10-CM | POA: Diagnosis not present

## 2020-03-16 DIAGNOSIS — Z0000 Encounter for general adult medical examination without abnormal findings: Secondary | ICD-10-CM | POA: Diagnosis not present

## 2020-03-16 DIAGNOSIS — M79674 Pain in right toe(s): Secondary | ICD-10-CM | POA: Diagnosis not present

## 2020-03-16 DIAGNOSIS — G4700 Insomnia, unspecified: Secondary | ICD-10-CM | POA: Diagnosis not present

## 2020-03-16 DIAGNOSIS — E0590 Thyrotoxicosis, unspecified without thyrotoxic crisis or storm: Secondary | ICD-10-CM | POA: Diagnosis not present

## 2020-05-16 DIAGNOSIS — B078 Other viral warts: Secondary | ICD-10-CM | POA: Diagnosis not present

## 2020-05-16 DIAGNOSIS — L814 Other melanin hyperpigmentation: Secondary | ICD-10-CM | POA: Diagnosis not present

## 2020-05-16 DIAGNOSIS — D1801 Hemangioma of skin and subcutaneous tissue: Secondary | ICD-10-CM | POA: Diagnosis not present

## 2020-05-16 DIAGNOSIS — D225 Melanocytic nevi of trunk: Secondary | ICD-10-CM | POA: Diagnosis not present

## 2020-05-16 DIAGNOSIS — L821 Other seborrheic keratosis: Secondary | ICD-10-CM | POA: Diagnosis not present

## 2020-06-23 DIAGNOSIS — L820 Inflamed seborrheic keratosis: Secondary | ICD-10-CM | POA: Diagnosis not present

## 2020-06-23 DIAGNOSIS — B078 Other viral warts: Secondary | ICD-10-CM | POA: Diagnosis not present

## 2020-06-25 IMAGING — MG DIGITAL DIAGNOSTIC UNILATERAL LEFT MAMMOGRAM WITH TOMO AND CAD
6 series · 6 of 18 positions shown · non-contrast
Comparison: Previous exam(s).

CLINICAL DATA: 57-year-old female for further evaluation of
possible LEFT breast asymmetry on screening mammogram

EXAM:
DIGITAL DIAGNOSTIC UNILATERAL LEFT MAMMOGRAM WITH CAD AND TOMO

[L MLO synth-2D]
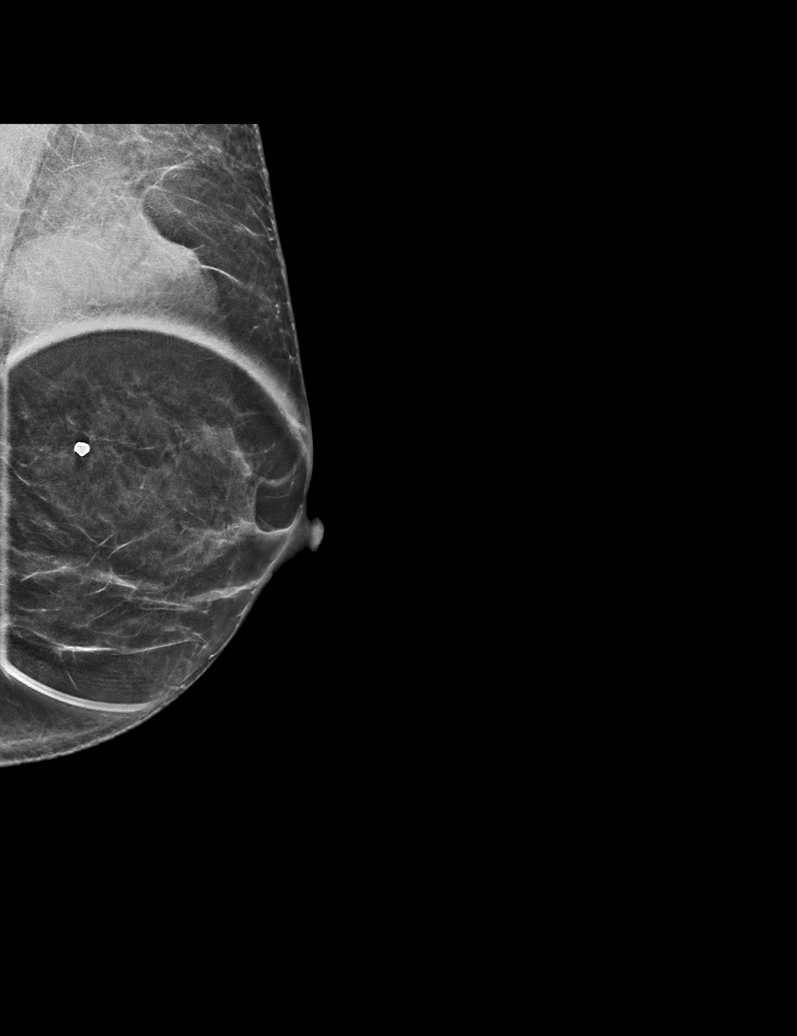

[L ML synth-2D]
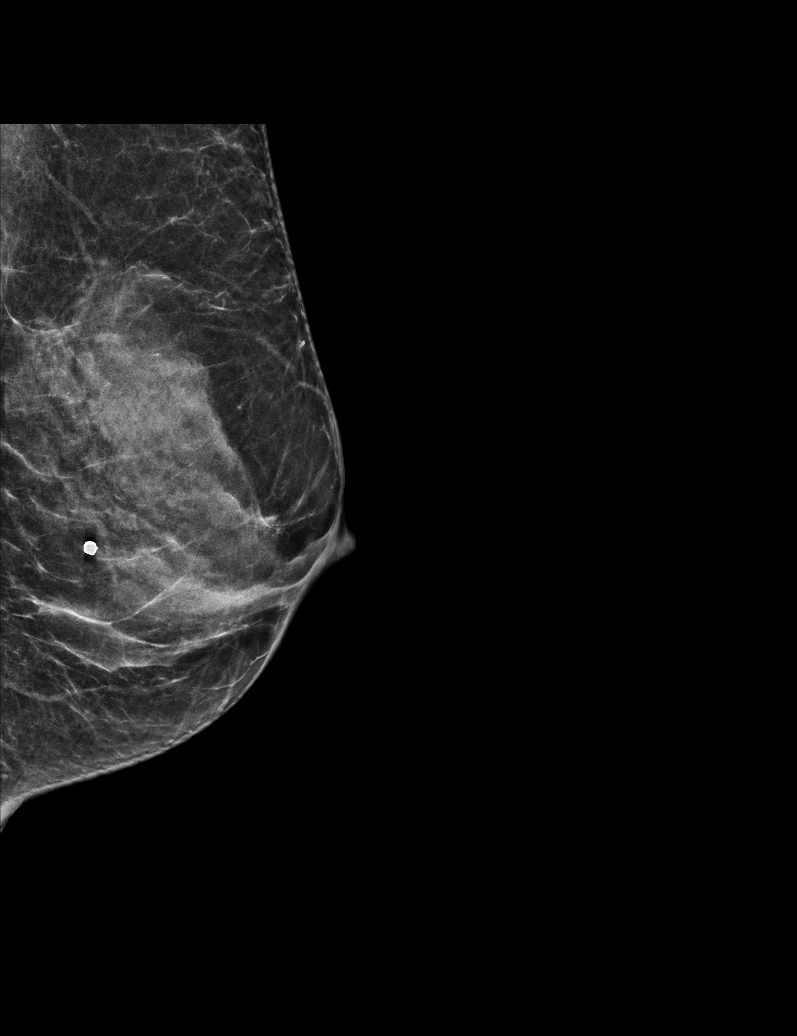

[L XCCL synth-2D]
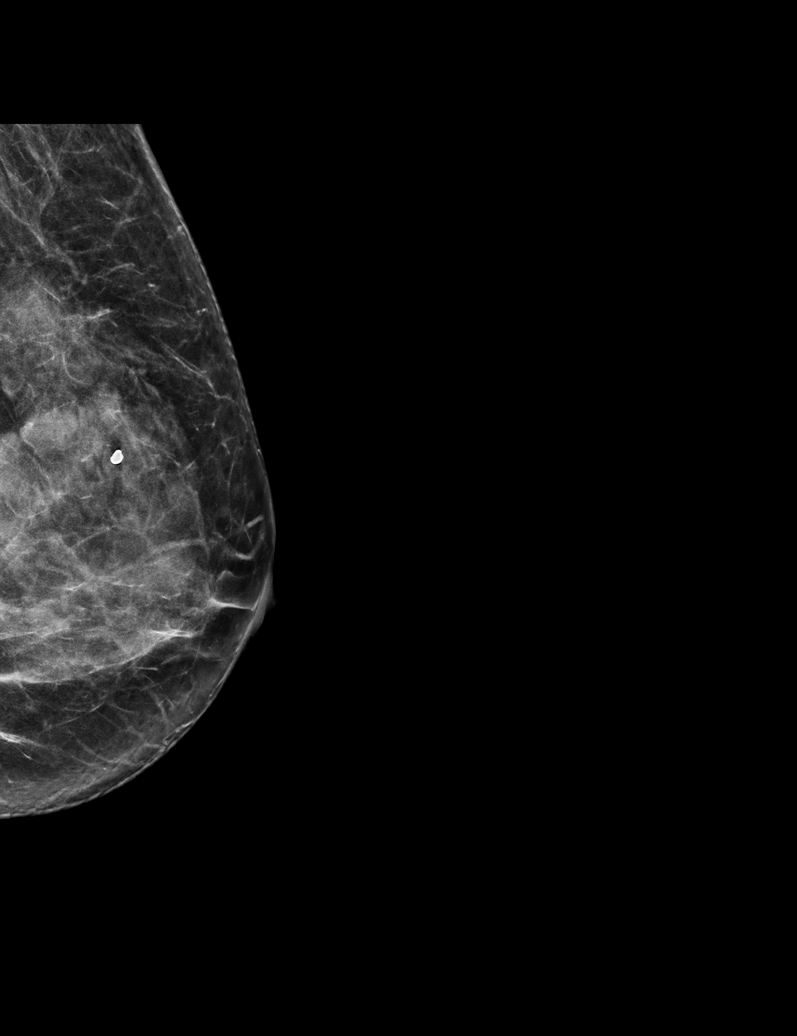

[L ML tomo · tomo slice 25/49.0]
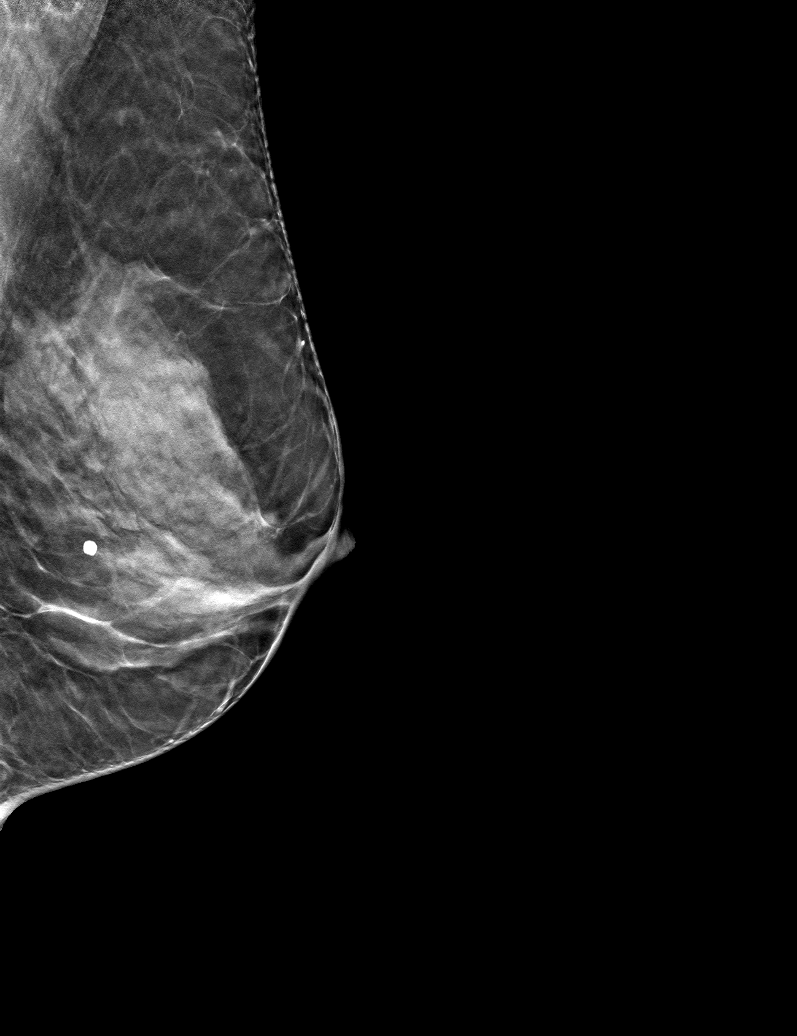

[L MLO tomo · tomo slice 23/46.0]
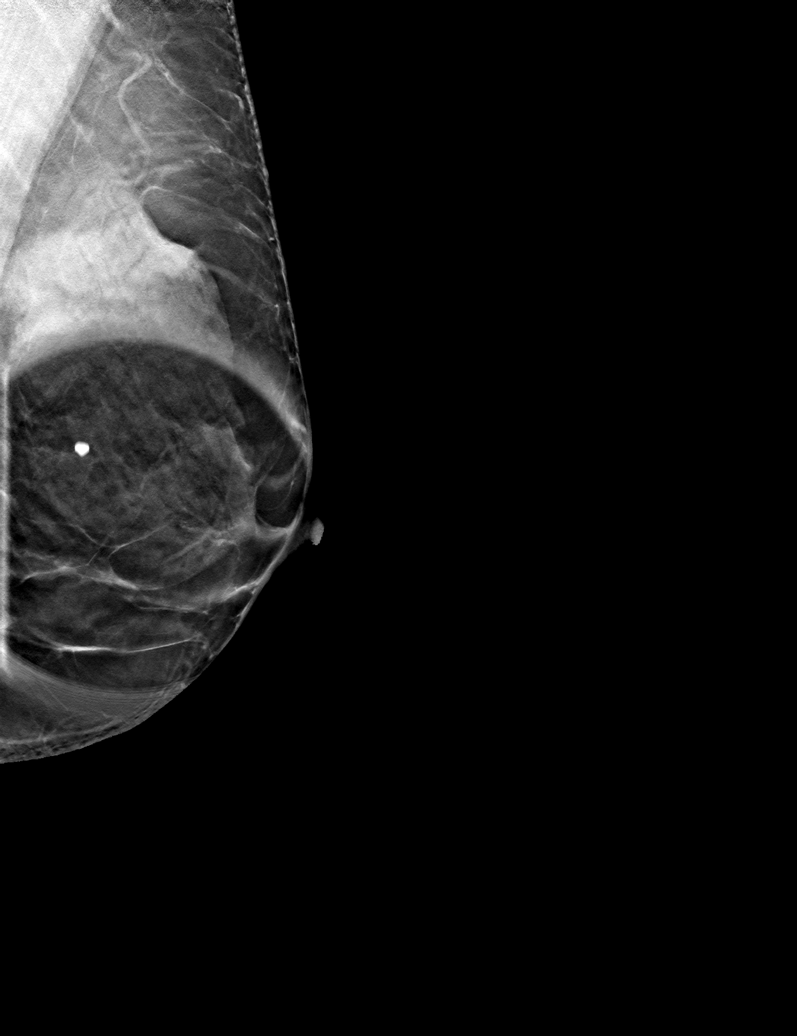

[L XCCL tomo · tomo slice 29/56.0]
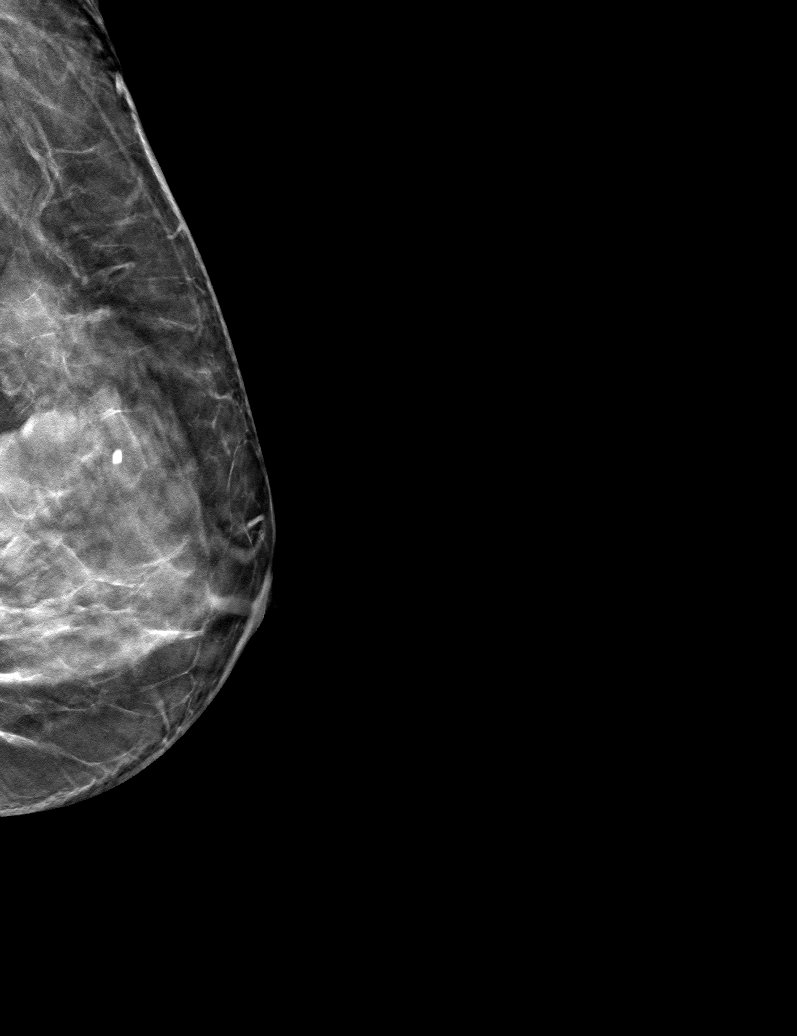

[6 of 18 positions shown; findings below may reference images not displayed]

ACR Breast Density Category c: The breast tissue is heterogeneously
dense, which may obscure small masses.
FINDINGS: 2D/3D full field and spot compression views of the LEFT breast
effacement of the asymmetry identified on the screening study. No
persistent abnormality is identified in the area of the screening
study finding.

Mammographic images were processed with CAD.
IMPRESSION: No persistent abnormality in the area of the screening study
finding.

RECOMMENDATION:
Bilateral screening mammogram in 1 year

I have discussed the findings and recommendations with the patient.
Results were also provided in writing at the conclusion of the
visit. If applicable, a reminder letter will be sent to the patient
regarding the next appointment.

BI-RADS CATEGORY  2: Benign.

## 2020-07-26 DIAGNOSIS — G43019 Migraine without aura, intractable, without status migrainosus: Secondary | ICD-10-CM | POA: Diagnosis not present

## 2020-07-26 DIAGNOSIS — G43719 Chronic migraine without aura, intractable, without status migrainosus: Secondary | ICD-10-CM | POA: Diagnosis not present

## 2020-07-26 DIAGNOSIS — G43111 Migraine with aura, intractable, with status migrainosus: Secondary | ICD-10-CM | POA: Diagnosis not present

## 2020-07-26 DIAGNOSIS — G43839 Menstrual migraine, intractable, without status migrainosus: Secondary | ICD-10-CM | POA: Diagnosis not present

## 2020-09-07 DIAGNOSIS — U071 COVID-19: Secondary | ICD-10-CM | POA: Diagnosis not present

## 2020-10-26 DIAGNOSIS — Z23 Encounter for immunization: Secondary | ICD-10-CM | POA: Diagnosis not present

## 2021-03-03 DIAGNOSIS — Z01419 Encounter for gynecological examination (general) (routine) without abnormal findings: Secondary | ICD-10-CM | POA: Diagnosis not present

## 2021-03-03 DIAGNOSIS — H401231 Low-tension glaucoma, bilateral, mild stage: Secondary | ICD-10-CM | POA: Diagnosis not present

## 2021-03-03 DIAGNOSIS — Z6822 Body mass index (BMI) 22.0-22.9, adult: Secondary | ICD-10-CM | POA: Diagnosis not present

## 2021-03-03 DIAGNOSIS — Z1231 Encounter for screening mammogram for malignant neoplasm of breast: Secondary | ICD-10-CM | POA: Diagnosis not present

## 2021-03-03 DIAGNOSIS — H5213 Myopia, bilateral: Secondary | ICD-10-CM | POA: Diagnosis not present

## 2021-03-03 DIAGNOSIS — H2513 Age-related nuclear cataract, bilateral: Secondary | ICD-10-CM | POA: Diagnosis not present

## 2021-03-27 DIAGNOSIS — M79674 Pain in right toe(s): Secondary | ICD-10-CM | POA: Diagnosis not present

## 2021-03-27 DIAGNOSIS — Z0000 Encounter for general adult medical examination without abnormal findings: Secondary | ICD-10-CM | POA: Diagnosis not present

## 2021-03-27 DIAGNOSIS — G4700 Insomnia, unspecified: Secondary | ICD-10-CM | POA: Diagnosis not present

## 2021-03-27 DIAGNOSIS — R739 Hyperglycemia, unspecified: Secondary | ICD-10-CM | POA: Diagnosis not present

## 2021-03-27 DIAGNOSIS — Z13220 Encounter for screening for lipoid disorders: Secondary | ICD-10-CM | POA: Diagnosis not present

## 2021-03-27 DIAGNOSIS — E0590 Thyrotoxicosis, unspecified without thyrotoxic crisis or storm: Secondary | ICD-10-CM | POA: Diagnosis not present

## 2021-08-19 DIAGNOSIS — M545 Low back pain, unspecified: Secondary | ICD-10-CM | POA: Diagnosis not present

## 2021-09-05 DIAGNOSIS — Z6822 Body mass index (BMI) 22.0-22.9, adult: Secondary | ICD-10-CM | POA: Diagnosis not present

## 2021-09-05 DIAGNOSIS — M545 Low back pain, unspecified: Secondary | ICD-10-CM | POA: Diagnosis not present

## 2021-09-12 DIAGNOSIS — S39013D Strain of muscle, fascia and tendon of pelvis, subsequent encounter: Secondary | ICD-10-CM | POA: Diagnosis not present

## 2021-09-12 DIAGNOSIS — M545 Low back pain, unspecified: Secondary | ICD-10-CM | POA: Diagnosis not present

## 2021-09-15 DIAGNOSIS — M545 Low back pain, unspecified: Secondary | ICD-10-CM | POA: Diagnosis not present

## 2021-09-15 DIAGNOSIS — S39013D Strain of muscle, fascia and tendon of pelvis, subsequent encounter: Secondary | ICD-10-CM | POA: Diagnosis not present

## 2021-09-18 DIAGNOSIS — M545 Low back pain, unspecified: Secondary | ICD-10-CM | POA: Diagnosis not present

## 2021-09-18 DIAGNOSIS — S39013D Strain of muscle, fascia and tendon of pelvis, subsequent encounter: Secondary | ICD-10-CM | POA: Diagnosis not present

## 2021-09-22 DIAGNOSIS — M545 Low back pain, unspecified: Secondary | ICD-10-CM | POA: Diagnosis not present

## 2021-09-22 DIAGNOSIS — S39013D Strain of muscle, fascia and tendon of pelvis, subsequent encounter: Secondary | ICD-10-CM | POA: Diagnosis not present

## 2021-09-26 DIAGNOSIS — S39013D Strain of muscle, fascia and tendon of pelvis, subsequent encounter: Secondary | ICD-10-CM | POA: Diagnosis not present

## 2021-09-26 DIAGNOSIS — M545 Low back pain, unspecified: Secondary | ICD-10-CM | POA: Diagnosis not present

## 2021-10-13 DIAGNOSIS — M545 Low back pain, unspecified: Secondary | ICD-10-CM | POA: Diagnosis not present

## 2021-10-13 DIAGNOSIS — S39013D Strain of muscle, fascia and tendon of pelvis, subsequent encounter: Secondary | ICD-10-CM | POA: Diagnosis not present

## 2021-11-01 DIAGNOSIS — S39013D Strain of muscle, fascia and tendon of pelvis, subsequent encounter: Secondary | ICD-10-CM | POA: Diagnosis not present

## 2021-11-01 DIAGNOSIS — M545 Low back pain, unspecified: Secondary | ICD-10-CM | POA: Diagnosis not present

## 2021-11-15 DIAGNOSIS — Z6822 Body mass index (BMI) 22.0-22.9, adult: Secondary | ICD-10-CM | POA: Diagnosis not present

## 2021-11-15 DIAGNOSIS — E039 Hypothyroidism, unspecified: Secondary | ICD-10-CM | POA: Diagnosis not present

## 2021-11-15 DIAGNOSIS — R7301 Impaired fasting glucose: Secondary | ICD-10-CM | POA: Diagnosis not present

## 2021-11-15 DIAGNOSIS — Z8669 Personal history of other diseases of the nervous system and sense organs: Secondary | ICD-10-CM | POA: Diagnosis not present

## 2021-11-23 DIAGNOSIS — M545 Low back pain, unspecified: Secondary | ICD-10-CM | POA: Diagnosis not present

## 2021-11-23 DIAGNOSIS — S39013D Strain of muscle, fascia and tendon of pelvis, subsequent encounter: Secondary | ICD-10-CM | POA: Diagnosis not present

## 2021-12-14 DIAGNOSIS — M545 Low back pain, unspecified: Secondary | ICD-10-CM | POA: Diagnosis not present

## 2021-12-14 DIAGNOSIS — S39013D Strain of muscle, fascia and tendon of pelvis, subsequent encounter: Secondary | ICD-10-CM | POA: Diagnosis not present

## 2021-12-20 DIAGNOSIS — M545 Low back pain, unspecified: Secondary | ICD-10-CM | POA: Diagnosis not present

## 2021-12-20 DIAGNOSIS — S39013D Strain of muscle, fascia and tendon of pelvis, subsequent encounter: Secondary | ICD-10-CM | POA: Diagnosis not present

## 2021-12-28 DIAGNOSIS — S39013D Strain of muscle, fascia and tendon of pelvis, subsequent encounter: Secondary | ICD-10-CM | POA: Diagnosis not present

## 2021-12-28 DIAGNOSIS — M545 Low back pain, unspecified: Secondary | ICD-10-CM | POA: Diagnosis not present

## 2023-06-16 DEATH — deceased
